# Patient Record
Sex: Male | Born: 2000 | Race: Black or African American | Hispanic: No | Marital: Single | Smoking: Never smoker
Health system: Southern US, Community
[De-identification: ages and names within clinical notes are randomized; demographics above are authoritative.]

## PROBLEM LIST (undated history)

## (undated) DIAGNOSIS — J45909 Unspecified asthma, uncomplicated: Secondary | ICD-10-CM

## (undated) HISTORY — DX: Unspecified asthma, uncomplicated: J45.909

## (undated) HISTORY — PX: SHOULDER SURGERY: SHX246

## (undated) HISTORY — PX: TONSILLECTOMY AND ADENOIDECTOMY: SUR1326

---
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
## 2022-09-19 ENCOUNTER — Other Ambulatory Visit: Payer: Self-pay

## 2022-09-19 ENCOUNTER — Emergency Department (HOSPITAL_COMMUNITY)
Admission: EM | Admit: 2022-09-19 | Discharge: 2022-09-19 | Disposition: A | Discharge status: Home / Self Care | Payer: Medicaid Other | Attending: Emergency Medicine | Admitting: Emergency Medicine

## 2022-09-19 ENCOUNTER — Encounter (HOSPITAL_COMMUNITY): Payer: Self-pay

## 2022-09-19 DIAGNOSIS — Z20822 Contact with and (suspected) exposure to covid-19: Secondary | ICD-10-CM | POA: Diagnosis not present

## 2022-09-19 DIAGNOSIS — K644 Residual hemorrhoidal skin tags: Secondary | ICD-10-CM | POA: Insufficient documentation

## 2022-09-19 DIAGNOSIS — K6289 Other specified diseases of anus and rectum: Secondary | ICD-10-CM | POA: Diagnosis present

## 2022-09-19 LAB — COMPREHENSIVE METABOLIC PANEL
ALT: 18 U/L (ref 0–44)
AST: 20 U/L (ref 15–41)
Albumin: 4.2 g/dL (ref 3.5–5.0)
Alkaline Phosphatase: 113 U/L (ref 38–126)
Anion gap: 12 (ref 5–15)
BUN: 7 mg/dL (ref 6–20)
CO2: 27 mmol/L (ref 22–32)
Calcium: 9.6 mg/dL (ref 8.9–10.3)
Chloride: 103 mmol/L (ref 98–111)
Creatinine, Ser: 0.98 mg/dL (ref 0.61–1.24)
GFR, Estimated: >60 mL/min (ref >60)
Glucose, Bld: 93 mg/dL (ref 70–99)
Potassium: 4.5 mmol/L (ref 3.5–5.1)
Sodium: 142 mmol/L (ref 135–145)
Total Bilirubin: 0.7 mg/dL (ref 0.3–1.2)
Total Protein: 7.6 g/dL (ref 6.5–8.1)

## 2022-09-19 LAB — URINALYSIS, ROUTINE W REFLEX MICROSCOPIC
Bacteria, UA: NONE SEEN
Bilirubin Urine: NEGATIVE
Glucose, UA: NEGATIVE mg/dL
Hgb urine dipstick: NEGATIVE
Ketones, ur: 20 mg/dL — AB
Leukocytes,Ua: NEGATIVE
Nitrite: NEGATIVE
Protein, ur: 30 mg/dL — AB
Specific Gravity, Urine: 1.028 (ref 1.005–1.030)
pH: 6 (ref 5.0–8.0)

## 2022-09-19 LAB — CBC
HCT: 48.5 % (ref 39.0–52.0)
Hemoglobin: 14.8 g/dL (ref 13.0–17.0)
MCH: 24.1 pg — ABNORMAL LOW (ref 26.0–34.0)
MCHC: 30.5 g/dL (ref 30.0–36.0)
MCV: 78.9 fL — ABNORMAL LOW (ref 80.0–100.0)
Platelets: 213 10*3/uL (ref 150–400)
RBC: 6.15 MIL/uL — ABNORMAL HIGH (ref 4.22–5.81)
RDW: 14.3 % (ref 11.5–15.5)
WBC: 8.9 10*3/uL (ref 4.0–10.5)
nRBC: 0 % (ref 0.0–0.2)

## 2022-09-19 LAB — RESP PANEL BY RT-PCR (FLU A&B, COVID) ARPGX2
Influenza A by PCR: NEGATIVE
Influenza B by PCR: NEGATIVE
SARS Coronavirus 2 by RT PCR: NEGATIVE

## 2022-09-19 LAB — LIPASE, BLOOD: Lipase: 33 U/L (ref 11–51)

## 2022-09-19 MED ORDER — NIFEDIPINE 0.3 % OINTMENT: 1.0000 | TOPICAL_OINTMENT | Freq: Four times a day (QID) | CUTANEOUS | 1 refills | Status: AC

## 2022-09-19 MED ORDER — OXYCODONE-ACETAMINOPHEN 5-325 MG PO TABS
1.0000 | ORAL_TABLET | Freq: Once | ORAL | Status: AC
  Administered 2022-09-19: 1 via ORAL
  Filled 2022-09-19: qty 1

## 2022-09-19 MED ORDER — ONDANSETRON 4 MG PO TBDP
4.0000 mg | ORAL_TABLET | ORAL | Status: DC | PRN
  Administered 2022-09-19: 4 mg via ORAL
  Filled 2022-09-19: qty 1

## 2022-09-19 NOTE — ED Notes (Signed)
Patient has a urine culture in the main lab 

## 2022-09-19 NOTE — ED Provider Triage Note (Signed)
Emergency Medicine Provider Triage Evaluation Note  Leonard Turner , a 21 y.o. male  was evaluated in triage.  Pt complains of rectal pain off and on for several weeks, worse since Saturday. No rectal bleeding, no constipation noted. Does endorse fever, chills. No known hx of hemorrhoids.  Review of Systems  Positive: Rectal pain Negative: Rectal bleeding  Physical Exam  BP 125/71 (BP Location: Right Arm)   Pulse 81   Temp 98.8 F (37.1 C) (Oral)   Resp 16   Ht 5\' 6"  (1.676 m)   Wt 67.1 kg   SpO2 95%   BMI 23.89 kg/m  Gen:   Awake, no distress   Resp:  Normal effort  MSK:   Moves extremities without difficulty  Other:  Rectal exam deferred until patient in room  Medical Decision Making  Medically screening exam initiated at 1:31 PM.  Appropriate orders placed.  Leonard Turner was informed that the remainder of the evaluation will be completed by another provider, this initial triage assessment does not replace that evaluation, and the importance of remaining in the ED until their evaluation is complete.  Workup initiated   Anselmo Pickler, [place] 09/19/22 1334

## 2022-09-19 NOTE — ED Triage Notes (Signed)
Pt to er, pt states that last week he was having some rectal pain, states that he thought that he was constipated and took some stool softeners, states that the pain has gotten worse.  States that last week he started having bowel movements and his pain was getting better. States that Saturday it has gotten worse. States that also he has started having some chills.

## 2022-09-19 NOTE — ED Provider Notes (Signed)
[facility] DEPT Provider Note   CSN: [id number] Arrival date & time: 09/19/22  1040     History  Chief Complaint  Patient presents with   Rectal Pain    DAWIT TANKARD is a 21 y.o. male    BERNIS SCHREUR , a 21 y.o. male  was evaluated in triage.  Pt complains of rectal pain off and on for several weeks, worse since Saturday. No rectal bleeding, no constipation noted. Does endorse fever, chills. No known hx of hemorrhoids.  Patient has been using sitz bath's, stool softeners, and Preparation H.  No previous history of rectal trauma, liver disease, cirrhosis, alcohol abuse.        HPI     Home Medications Prior to Admission medications   Medication Sig Start Date End Date Taking? Authorizing Provider  nifedipine 0.3 % ointment Place 1 Application rectally 4 (four) times daily. 09/19/22  Yes Ulyana Pitones H, PA-C      Allergies    Patient has no known allergies.    Review of Systems   Review of Systems  Gastrointestinal:  Positive for rectal pain.  All other systems reviewed and are negative.   Physical Exam Updated Vital Signs BP 132/73 (BP Location: Right Arm)   Pulse 77   Temp 99.1 F (37.3 C) (Oral)   Resp 16   Ht 5\' 6"  (1.676 m)   Wt 67.1 kg   SpO2 98%   BMI 23.89 kg/m  Physical Exam Vitals and nursing note reviewed.  Constitutional:      General: He is not in acute distress.    Appearance: Normal appearance.  HENT:     Head: Normocephalic and atraumatic.  Eyes:     General:        Right eye: No discharge.        Left eye: No discharge.  Cardiovascular:     Rate and Rhythm: Normal rate and regular rhythm.  Pulmonary:     Effort: Pulmonary effort is normal. No respiratory distress.  Genitourinary:    Comments: Some redness, irritation of the rectum with single partially thrombosed hemorrhoid noted.  No active bleeding, no signs of rectal fissure, abscess. Musculoskeletal:        General: No  deformity.  Skin:    General: Skin is warm and dry.  Neurological:     Mental Status: He is alert and oriented to person, place, and time.  Psychiatric:        Mood and Affect: Mood normal.        Behavior: Behavior normal.     ED Results / Procedures / Treatments   Labs (all labs ordered are listed, but only abnormal results are displayed) Labs Reviewed  CBC - Abnormal; Notable for the following components:      Result Value   RBC 6.15 (*)    MCV 78.9 (*)    MCH 24.1 (*)    All other components within normal limits  RESP PANEL BY RT-PCR (FLU A&B, COVID) ARPGX2  LIPASE, BLOOD  COMPREHENSIVE METABOLIC PANEL  URINALYSIS, ROUTINE W REFLEX MICROSCOPIC    EKG None  Radiology No results found.  Procedures Procedures    Medications Ordered in ED Medications  ondansetron (ZOFRAN-ODT) disintegrating tablet 4 mg (4 mg Oral Given 09/19/22 1317)  oxyCODONE-acetaminophen (PERCOCET/ROXICET) 5-325 MG per tablet 1 tablet (1 tablet Oral Given 09/19/22 1317)    ED Course/ Medical Decision Making/ A&P  Medical Decision Making Amount and/or Complexity of Data Reviewed Labs: ordered.  Risk Prescription drug management.   This is an overall well-appearing 21 year old male with overall noncontributory past medical hx who presents with intermittent rectal pain for 2 weeks, worse since Saturday. Also endorses some chills.  I independently interpreted RVP, CBC, lipase, CMP, UA pending at this time with patient without any urinary symptoms.  His lab work is unremarkable, RVP negative for COVID, flu, discussed I am not sure what his subacute fever, chills are from but may be related to an upper respiratory infection or other viral infection.  Appearance of the rectum was notable for external partially thrombosed hemorrhoid.  No active bleeding, no evidence of rectal fissure, abscess, or other abnormality.  Encourage sitz bath's, stool softeners, nifedipine, surgical  follow-up as needed.  Patient understands agrees to plan, is discharged in stable condition, pain minimally improved at time of discharge secondary to Percocet x1. Final Clinical Impression(s) / ED Diagnoses Final diagnoses:  Rectal pain  External hemorrhoids    Rx / DC Orders ED Discharge Orders          Ordered    nifedipine 0.3 % ointment  4 times daily        09/19/22 1610              [place] Frieden, [place], PA-C 09/19/22 1616    Gerhard Munch, MD 09/19/22 1707

## 2022-09-19 NOTE — Discharge Instructions (Signed)
Please use Tylenol or ibuprofen for pain.  You may use 600 mg ibuprofen every 6 hours or 1000 mg of Tylenol every 6 hours.  You may choose to alternate between the 2.  This would be most effective.  Not to exceed 4 g of Tylenol within 24 hours.  Not to exceed 3200 mg ibuprofen 24 hours.
